# Patient Record
Sex: Male | Born: 1985 | Race: Black or African American | Hispanic: No | Smoking: Never smoker
Health system: Southern US, Community
[De-identification: ages and names within clinical notes are randomized; demographics above are authoritative.]

## PROBLEM LIST (undated history)

## (undated) DIAGNOSIS — S43006A Unspecified dislocation of unspecified shoulder joint, initial encounter: Secondary | ICD-10-CM

## (undated) HISTORY — PX: HAND SURGERY: SHX662

---
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
## 2012-03-12 ENCOUNTER — Emergency Department (HOSPITAL_COMMUNITY): Payer: Self-pay

## 2012-03-12 ENCOUNTER — Inpatient Hospital Stay (HOSPITAL_COMMUNITY): Payer: Self-pay

## 2012-03-12 ENCOUNTER — Encounter (HOSPITAL_COMMUNITY): Payer: Self-pay | Admitting: Emergency Medicine

## 2012-03-12 ENCOUNTER — Emergency Department (HOSPITAL_COMMUNITY): Payer: Self-pay | Admitting: Anesthesiology

## 2012-03-12 ENCOUNTER — Encounter (HOSPITAL_COMMUNITY): Admission: EM | Disposition: A | Discharge status: Home / Self Care | Payer: Self-pay | Source: Home / Self Care

## 2012-03-12 ENCOUNTER — Encounter (HOSPITAL_COMMUNITY): Payer: Self-pay | Admitting: Anesthesiology

## 2012-03-12 ENCOUNTER — Inpatient Hospital Stay (HOSPITAL_COMMUNITY)
Admission: EM | Admit: 2012-03-12 | Discharge: 2012-03-14 | DRG: 513 | Disposition: A | Discharge status: Home / Self Care | Payer: MEDICAID | Attending: Surgery | Admitting: Surgery

## 2012-03-12 DIAGNOSIS — S43036A Inferior dislocation of unspecified humerus, initial encounter: Principal | ICD-10-CM | POA: Diagnosis present

## 2012-03-12 DIAGNOSIS — S61409A Unspecified open wound of unspecified hand, initial encounter: Secondary | ICD-10-CM | POA: Diagnosis present

## 2012-03-12 DIAGNOSIS — S0180XA Unspecified open wound of other part of head, initial encounter: Secondary | ICD-10-CM

## 2012-03-12 DIAGNOSIS — Y9229 Other specified public building as the place of occurrence of the external cause: Secondary | ICD-10-CM

## 2012-03-12 DIAGNOSIS — S51819A Laceration without foreign body of unspecified forearm, initial encounter: Secondary | ICD-10-CM

## 2012-03-12 DIAGNOSIS — S41109A Unspecified open wound of unspecified upper arm, initial encounter: Secondary | ICD-10-CM

## 2012-03-12 DIAGNOSIS — S51809A Unspecified open wound of unspecified forearm, initial encounter: Secondary | ICD-10-CM | POA: Diagnosis present

## 2012-03-12 DIAGNOSIS — S0181XA Laceration without foreign body of other part of head, initial encounter: Secondary | ICD-10-CM | POA: Diagnosis present

## 2012-03-12 DIAGNOSIS — S0100XA Unspecified open wound of scalp, initial encounter: Secondary | ICD-10-CM | POA: Diagnosis present

## 2012-03-12 DIAGNOSIS — S0101XA Laceration without foreign body of scalp, initial encounter: Secondary | ICD-10-CM | POA: Diagnosis present

## 2012-03-12 DIAGNOSIS — S61209A Unspecified open wound of unspecified finger without damage to nail, initial encounter: Secondary | ICD-10-CM | POA: Diagnosis present

## 2012-03-12 DIAGNOSIS — S43006A Unspecified dislocation of unspecified shoulder joint, initial encounter: Secondary | ICD-10-CM

## 2012-03-12 DIAGNOSIS — S41119A Laceration without foreign body of unspecified upper arm, initial encounter: Secondary | ICD-10-CM

## 2012-03-12 DIAGNOSIS — S01409A Unspecified open wound of unspecified cheek and temporomandibular area, initial encounter: Secondary | ICD-10-CM | POA: Diagnosis present

## 2012-03-12 DIAGNOSIS — S0191XA Laceration without foreign body of unspecified part of head, initial encounter: Secondary | ICD-10-CM

## 2012-03-12 DIAGNOSIS — S61419A Laceration without foreign body of unspecified hand, initial encounter: Secondary | ICD-10-CM

## 2012-03-12 DIAGNOSIS — S41009A Unspecified open wound of unspecified shoulder, initial encounter: Secondary | ICD-10-CM | POA: Diagnosis present

## 2012-03-12 HISTORY — PX: I & D EXTREMITY: SHX5045

## 2012-03-12 LAB — PROTIME-INR
INR: 1.13 (ref 0.00–1.49)
Prothrombin Time: 20.1 seconds — ABNORMAL HIGH (ref 11.6–15.2)

## 2012-03-12 LAB — BASIC METABOLIC PANEL
Chloride: 111 mEq/L (ref 96–112)
GFR calc Af Amer: >90 mL/min (ref >90)
GFR calc non Af Amer: >90 mL/min (ref >90)
Potassium: 3.4 mEq/L — ABNORMAL LOW (ref 3.5–5.1)
Sodium: 144 mEq/L (ref 135–145)

## 2012-03-12 LAB — URINALYSIS, MICROSCOPIC ONLY
Glucose, UA: NEGATIVE mg/dL
Ketones, ur: NEGATIVE mg/dL
Leukocytes, UA: NEGATIVE
Nitrite: NEGATIVE
Protein, ur: NEGATIVE mg/dL
Urobilinogen, UA: 0.2 mg/dL (ref 0.0–1.0)

## 2012-03-12 LAB — CBC
MCH: 25 pg — ABNORMAL LOW (ref 26.0–34.0)
MCHC: 32.6 g/dL (ref 30.0–36.0)
MCHC: 31.4 g/dL (ref 30.0–36.0)
Platelets: 151 10*3/uL (ref 150–400)
Platelets: 98 10*3/uL — ABNORMAL LOW (ref 150–400)
RDW: 14.5 % (ref 11.5–15.5)
RDW: 14.7 % (ref 11.5–15.5)
WBC: 6.3 10*3/uL (ref 4.0–10.5)

## 2012-03-12 LAB — TYPE AND SCREEN
Antibody Screen: NEGATIVE
Unit division: 0

## 2012-03-12 LAB — POCT I-STAT, CHEM 8
BUN: 14 mg/dL (ref 6–23)
Chloride: 109 mEq/L (ref 96–112)
Glucose, Bld: 140 mg/dL — ABNORMAL HIGH (ref 70–99)
HCT: 44 % (ref 39.0–52.0)
Potassium: 2.8 mEq/L — ABNORMAL LOW (ref 3.5–5.1)

## 2012-03-12 LAB — GLUCOSE, CAPILLARY: Glucose-Capillary: 105 mg/dL — ABNORMAL HIGH (ref 70–99)

## 2012-03-12 LAB — COMPREHENSIVE METABOLIC PANEL
ALT: 18 U/L (ref 0–53)
AST: 25 U/L (ref 0–37)
Alkaline Phosphatase: 49 U/L (ref 39–117)
CO2: 21 mEq/L (ref 19–32)
Chloride: 106 mEq/L (ref 96–112)
GFR calc Af Amer: 89 mL/min — ABNORMAL LOW (ref >90)
GFR calc non Af Amer: 77 mL/min — ABNORMAL LOW (ref >90)
Glucose, Bld: 147 mg/dL — ABNORMAL HIGH (ref 70–99)
Sodium: 144 mEq/L (ref 135–145)
Total Bilirubin: 0.3 mg/dL (ref 0.3–1.2)

## 2012-03-12 LAB — MRSA PCR SCREENING: MRSA by PCR: NEGATIVE

## 2012-03-12 SURGERY — WOUND EXPLORATION
Anesthesia: General | Site: Head | Wound class: Dirty or Infected

## 2012-03-12 MED ORDER — HYDROGEN PEROXIDE 3 % EX SOLN
CUTANEOUS | Status: DC | PRN
  Administered 2012-03-12: 1 via TOPICAL

## 2012-03-12 MED ORDER — KCL IN DEXTROSE-NACL 20-5-0.45 MEQ/L-%-% IV SOLN
INTRAVENOUS | Status: DC
  Administered 2012-03-12: 1000 mL via INTRAVENOUS
  Administered 2012-03-12 – 2012-03-13 (×3): via INTRAVENOUS
  Filled 2012-03-12 (×8): qty 1000

## 2012-03-12 MED ORDER — KCL IN DEXTROSE-NACL 20-5-0.45 MEQ/L-%-% IV SOLN
INTRAVENOUS | Status: AC
  Administered 2012-03-12: 1000 mL via INTRAVENOUS
  Filled 2012-03-12: qty 1000

## 2012-03-12 MED ORDER — MIDAZOLAM HCL 2 MG/2ML IJ SOLN: 1.0000 mg | INTRAMUSCULAR | Status: DC | PRN

## 2012-03-12 MED ORDER — ONDANSETRON HCL 4 MG/2ML IJ SOLN
4.0000 mg | Freq: Four times a day (QID) | INTRAMUSCULAR | Status: DC | PRN
  Administered 2012-03-12: 4 mg via INTRAVENOUS
  Filled 2012-03-12: qty 2

## 2012-03-12 MED ORDER — 0.9 % SODIUM CHLORIDE (POUR BTL) OPTIME
TOPICAL | Status: DC | PRN
  Administered 2012-03-12: 1000 mL

## 2012-03-12 MED ORDER — CEFAZOLIN SODIUM 1-5 GM-% IV SOLN
1.0000 g | Freq: Three times a day (TID) | INTRAVENOUS | Status: DC
  Administered 2012-03-12 – 2012-03-14 (×6): 1 g via INTRAVENOUS
  Filled 2012-03-12 (×9): qty 50

## 2012-03-12 MED ORDER — PHENYLEPHRINE HCL 10 MG/ML IJ SOLN
INTRAMUSCULAR | Status: DC | PRN
  Administered 2012-03-12: 100 ug via INTRAVENOUS
  Administered 2012-03-12: 10 ug via INTRAVENOUS
  Administered 2012-03-12 (×3): 100 ug via INTRAVENOUS

## 2012-03-12 MED ORDER — PANTOPRAZOLE SODIUM 40 MG PO TBEC
40.0000 mg | DELAYED_RELEASE_TABLET | Freq: Every day | ORAL | Status: DC
  Administered 2012-03-12 – 2012-03-14 (×3): 40 mg via ORAL
  Filled 2012-03-12 (×3): qty 1

## 2012-03-12 MED ORDER — EPHEDRINE SULFATE 50 MG/ML IJ SOLN
INTRAMUSCULAR | Status: DC | PRN
  Administered 2012-03-12: 5 mg via INTRAVENOUS
  Administered 2012-03-12: 10 mg via INTRAVENOUS

## 2012-03-12 MED ORDER — MORPHINE SULFATE 2 MG/ML IJ SOLN
2.0000 mg | INTRAMUSCULAR | Status: DC | PRN
  Administered 2012-03-12: 2 mg via INTRAVENOUS
  Filled 2012-03-12: qty 1

## 2012-03-12 MED ORDER — HYDROCODONE-ACETAMINOPHEN 5-325 MG PO TABS: 0.5000 | ORAL_TABLET | ORAL | Status: DC | PRN

## 2012-03-12 MED ORDER — PROMETHAZINE HCL 25 MG/ML IJ SOLN: 6.2500 mg | INTRAMUSCULAR | Status: DC | PRN

## 2012-03-12 MED ORDER — ONDANSETRON HCL 4 MG PO TABS: 4.0000 mg | ORAL_TABLET | Freq: Four times a day (QID) | ORAL | Status: DC | PRN

## 2012-03-12 MED ORDER — HYDROMORPHONE HCL PF 1 MG/ML IJ SOLN: 0.2500 mg | INTRAMUSCULAR | Status: DC | PRN

## 2012-03-12 MED ORDER — OXYCODONE HCL 5 MG PO TABS
10.0000 mg | ORAL_TABLET | ORAL | Status: DC | PRN
  Administered 2012-03-12 – 2012-03-14 (×6): 10 mg via ORAL
  Filled 2012-03-12: qty 2
  Filled 2012-03-12: qty 1
  Filled 2012-03-12: qty 2
  Filled 2012-03-12: qty 1
  Filled 2012-03-12 (×4): qty 2

## 2012-03-12 MED ORDER — DOUBLE ANTIBIOTIC 500-10000 UNIT/GM EX OINT
TOPICAL_OINTMENT | CUTANEOUS | Status: AC
  Filled 2012-03-12: qty 1

## 2012-03-12 MED ORDER — PROPOFOL 10 MG/ML IV EMUL
INTRAVENOUS | Status: DC | PRN
  Administered 2012-03-12: 180 mg via INTRAVENOUS

## 2012-03-12 MED ORDER — HETASTARCH-ELECTROLYTES 6 % IV SOLN
INTRAVENOUS | Status: DC | PRN
  Administered 2012-03-12 (×2): via INTRAVENOUS

## 2012-03-12 MED ORDER — FENTANYL CITRATE 0.05 MG/ML IJ SOLN: 50.0000 ug | INTRAMUSCULAR | Status: DC | PRN

## 2012-03-12 MED ORDER — GLYCOPYRROLATE 0.2 MG/ML IJ SOLN
INTRAMUSCULAR | Status: DC | PRN
  Administered 2012-03-12: .5 mg via INTRAVENOUS

## 2012-03-12 MED ORDER — MORPHINE SULFATE 4 MG/ML IJ SOLN
4.0000 mg | INTRAMUSCULAR | Status: DC | PRN
  Administered 2012-03-13: 4 mg via INTRAVENOUS
  Filled 2012-03-12: qty 1

## 2012-03-12 MED ORDER — SODIUM CHLORIDE 0.9 % IV SOLN
INTRAVENOUS | Status: DC | PRN
  Administered 2012-03-12 (×2): via INTRAVENOUS

## 2012-03-12 MED ORDER — LACTATED RINGERS IV SOLN
INTRAVENOUS | Status: DC | PRN
  Administered 2012-03-12 (×3): via INTRAVENOUS

## 2012-03-12 MED ORDER — ROCURONIUM BROMIDE 100 MG/10ML IV SOLN
INTRAVENOUS | Status: DC | PRN
  Administered 2012-03-12: 20 mg via INTRAVENOUS

## 2012-03-12 MED ORDER — CEFAZOLIN SODIUM 1-5 GM-% IV SOLN
INTRAVENOUS | Status: DC | PRN
  Administered 2012-03-12 (×2): 1 g via INTRAVENOUS

## 2012-03-12 MED ORDER — LIDOCAINE HCL (CARDIAC) 20 MG/ML IV SOLN
INTRAVENOUS | Status: DC | PRN
  Administered 2012-03-12: 100 mg via INTRAVENOUS

## 2012-03-12 MED ORDER — PANTOPRAZOLE SODIUM 40 MG IV SOLR
40.0000 mg | Freq: Every day | INTRAVENOUS | Status: DC
  Filled 2012-03-12 (×2): qty 40

## 2012-03-12 MED ORDER — NEOSTIGMINE METHYLSULFATE 1 MG/ML IJ SOLN
INTRAMUSCULAR | Status: DC | PRN
  Administered 2012-03-12: 4 mg via INTRAVENOUS

## 2012-03-12 MED ORDER — OXYCODONE HCL 5 MG PO TABS
5.0000 mg | ORAL_TABLET | ORAL | Status: DC | PRN
  Administered 2012-03-13: 5 mg via ORAL
  Filled 2012-03-12: qty 1

## 2012-03-12 MED ORDER — FENTANYL CITRATE 0.05 MG/ML IJ SOLN
INTRAMUSCULAR | Status: DC | PRN
  Administered 2012-03-12: 250 ug via INTRAVENOUS

## 2012-03-12 MED ORDER — BACITRACIN ZINC 500 UNIT/GM EX OINT
1.0000 "application " | TOPICAL_OINTMENT | Freq: Two times a day (BID) | CUTANEOUS | Status: DC
  Administered 2012-03-12 – 2012-03-14 (×5): 1 via TOPICAL
  Filled 2012-03-12 (×2): qty 15

## 2012-03-12 MED ORDER — MORPHINE SULFATE 2 MG/ML IJ SOLN
1.0000 mg | INTRAMUSCULAR | Status: DC | PRN
  Administered 2012-03-12: 1 mg via INTRAVENOUS
  Filled 2012-03-12: qty 1

## 2012-03-12 MED ORDER — SUCCINYLCHOLINE CHLORIDE 20 MG/ML IJ SOLN
INTRAMUSCULAR | Status: DC | PRN
  Administered 2012-03-12: 140 mg via INTRAVENOUS

## 2012-03-12 MED ORDER — MORPHINE SULFATE 2 MG/ML IJ SOLN
INTRAMUSCULAR | Status: AC
  Administered 2012-03-12: 2 mg via INTRAVENOUS
  Filled 2012-03-12: qty 1

## 2012-03-12 MED ORDER — ONDANSETRON HCL 4 MG/2ML IJ SOLN
INTRAMUSCULAR | Status: DC | PRN
  Administered 2012-03-12: 4 mg via INTRAVENOUS

## 2012-03-12 SURGICAL SUPPLY — 55 items
BANDAGE ELASTIC 4 VELCRO ST LF (GAUZE/BANDAGES/DRESSINGS) ×3 IMPLANT
BANDAGE ESMARK 6X9 LF (GAUZE/BANDAGES/DRESSINGS) ×2 IMPLANT
BANDAGE GAUZE 4  KLING STR (GAUZE/BANDAGES/DRESSINGS) ×3 IMPLANT
BANDAGE GAUZE ELAST BULKY 4 IN (GAUZE/BANDAGES/DRESSINGS) ×3 IMPLANT
BLADE SURG 10 STRL SS (BLADE) ×3 IMPLANT
BNDG COHESIVE 4X5 TAN STRL (GAUZE/BANDAGES/DRESSINGS) ×3 IMPLANT
BNDG COHESIVE 6X5 TAN STRL LF (GAUZE/BANDAGES/DRESSINGS) ×3 IMPLANT
BNDG ESMARK 6X9 LF (GAUZE/BANDAGES/DRESSINGS) ×3
CHLORAPREP W/TINT 26ML (MISCELLANEOUS) ×3 IMPLANT
CLOTH BEACON ORANGE TIMEOUT ST (SAFETY) ×3 IMPLANT
COVER SURGICAL LIGHT HANDLE (MISCELLANEOUS) ×3 IMPLANT
CUFF TOURNIQUET SINGLE 34IN LL (TOURNIQUET CUFF) IMPLANT
CUFF TOURNIQUET SINGLE 44IN (TOURNIQUET CUFF) IMPLANT
DRAPE U-SHAPE 47X51 STRL (DRAPES) ×3 IMPLANT
DRSG ADAPTIC 3X8 NADH LF (GAUZE/BANDAGES/DRESSINGS) ×3 IMPLANT
DRSG MEPILEX BORDER 4X4 (GAUZE/BANDAGES/DRESSINGS) ×6 IMPLANT
DRSG PAD ABDOMINAL 8X10 ST (GAUZE/BANDAGES/DRESSINGS) IMPLANT
ELECT REM PT RETURN 9FT ADLT (ELECTROSURGICAL) ×3
ELECTRODE REM PT RTRN 9FT ADLT (ELECTROSURGICAL) ×2 IMPLANT
GAUZE SPONGE 2X2 8PLY STRL LF (GAUZE/BANDAGES/DRESSINGS) ×2 IMPLANT
GAUZE XEROFORM 5X9 LF (GAUZE/BANDAGES/DRESSINGS) ×3 IMPLANT
GLOVE BIO SURGEON STRL SZ8 (GLOVE) ×3 IMPLANT
GLOVE BIOGEL PI IND STRL 8 (GLOVE) ×2 IMPLANT
GLOVE BIOGEL PI INDICATOR 8 (GLOVE) ×1
GOWN PREVENTION PLUS XLARGE (GOWN DISPOSABLE) ×3 IMPLANT
GOWN STRL NON-REIN LRG LVL3 (GOWN DISPOSABLE) ×6 IMPLANT
KIT BASIN OR (CUSTOM PROCEDURE TRAY) ×3 IMPLANT
KIT ROOM TURNOVER OR (KITS) ×3 IMPLANT
MANIFOLD NEPTUNE II (INSTRUMENTS) ×3 IMPLANT
NS IRRIG 1000ML POUR BTL (IV SOLUTION) ×3 IMPLANT
PACK ORTHO EXTREMITY (CUSTOM PROCEDURE TRAY) ×3 IMPLANT
PAD ARMBOARD 7.5X6 YLW CONV (MISCELLANEOUS) ×6 IMPLANT
PAD CAST 4YDX4 CTTN HI CHSV (CAST SUPPLIES) ×2 IMPLANT
PADDING CAST COTTON 4X4 STRL (CAST SUPPLIES) ×1
SOAP 2 % CHG 4 OZ (WOUND CARE) ×3 IMPLANT
SPLINT PLASTER CAST XFAST 4X15 (CAST SUPPLIES) ×2 IMPLANT
SPLINT PLASTER XTRA FAST SET 4 (CAST SUPPLIES) ×1
SPONGE GAUZE 2X2 STER 10/PKG (GAUZE/BANDAGES/DRESSINGS) ×1
SPONGE GAUZE 4X4 12PLY (GAUZE/BANDAGES/DRESSINGS) ×3 IMPLANT
STAPLER VISISTAT 35W (STAPLE) IMPLANT
SUCTION FRAZIER TIP 10 FR DISP (SUCTIONS) ×3 IMPLANT
SUT ETHIBOND 4 0 TF (SUTURE) ×9 IMPLANT
SUT ETHILON 3 0 FSL (SUTURE) IMPLANT
SUT MNCRL AB 3-0 PS2 18 (SUTURE) ×12 IMPLANT
SUT PROLENE 3 0 PS 2 (SUTURE) ×3 IMPLANT
SUT VIC AB 2-0 CT1 27 (SUTURE) ×2
SUT VIC AB 2-0 CT1 TAPERPNT 27 (SUTURE) ×4 IMPLANT
SUT VIC AB 3-0 PS2 18 (SUTURE) ×1
SUT VIC AB 3-0 PS2 18XBRD (SUTURE) ×2 IMPLANT
SUT VIC AB 5-0 PS2 18 (SUTURE) ×9 IMPLANT
TOWEL OR 17X24 6PK STRL BLUE (TOWEL DISPOSABLE) ×3 IMPLANT
TOWEL OR 17X26 10 PK STRL BLUE (TOWEL DISPOSABLE) ×3 IMPLANT
TUBE CONNECTING 12X1/4 (SUCTIONS) ×3 IMPLANT
WATER STERILE IRR 1000ML POUR (IV SOLUTION) ×3 IMPLANT
YANKAUER SUCT BULB TIP NO VENT (SUCTIONS) ×3 IMPLANT

## 2012-03-12 NOTE — ED Notes (Signed)
PT ROLLED TO OR; REPORT GIVEN TO SUSAN, CRNA

## 2012-03-12 NOTE — Progress Notes (Signed)
Ice applied to right side of face over intact suture lines.  No drainage from incisions and no swelling noted.  Patient seems comfortable following earlier dose of Morphine 2mg .Foley catheter removed intact and medium sized condom catheter applied with patient's understanding.

## 2012-03-12 NOTE — ED Notes (Signed)
[place]. A&Ox3; no respiratory distress.

## 2012-03-12 NOTE — ED Notes (Signed)
CSI took pt's shirts. Other belongings logged with security.

## 2012-03-12 NOTE — ED Notes (Signed)
1 pair of jeans, 1 black belt , 1 pair of shoes, 1 pair of socks all collected and placed in pt belongings bag. Wallet, keys and phone given to security.
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
## 2012-03-12 NOTE — Anesthesia Preprocedure Evaluation (Addendum)
Anesthesia Evaluation  Patient identified by MRN, date of birth, ID band Patient awake    Reviewed: Allergy & Precautions, H&P , NPO status , Patient's Chart, lab work & pertinent test results  Airway Mallampati: II TM Distance: >3 FB Neck ROM: Full    Dental  (+) Teeth Intact and Dental Advisory Given   Pulmonary Current Smoker,  breath sounds clear to auscultation        Cardiovascular Rhythm:Regular Rate:Tachycardia     Neuro/Psych    GI/Hepatic (+)     substance abuse  alcohol use and marijuana use,   Endo/Other    Renal/GU      Musculoskeletal   Abdominal   Peds  Hematology   Anesthesia Other Findings signif ETOH smell Heavy scalp bleeding, smaller arms lacerations, dislocated r shoulder  Reproductive/Obstetrics                         Anesthesia Physical Anesthesia Plan  ASA: II and Emergent  Anesthesia Plan: General   Post-op Pain Management:    Induction: Intravenous, Rapid sequence and Cricoid pressure planned  Airway Management Planned: Oral ETT  Additional Equipment:   Intra-op Plan:   Post-operative Plan: Extubation in OR  Informed Consent: I have reviewed the patients History and Physical, chart, labs and discussed the procedure including the risks, benefits and alternatives for the proposed anesthesia with the patient or authorized representative who has indicated his/her understanding and acceptance.     Plan Discussed with: CRNA and Surgeon  Anesthesia Plan Comments:        Anesthesia Quick Evaluation

## 2012-03-12 NOTE — ED Notes (Signed)
XRAY AT THIS TIME.

## 2012-03-12 NOTE — Anesthesia Postprocedure Evaluation (Signed)
  Anesthesia Post-op Note  Patient: [name]  Procedure(s) Performed: Procedure(s) (LRB): WOUND EXPLORATION (N/A) SCALP LACERATION REPAIR (N/A) IRRIGATION AND DEBRIDEMENT EXTREMITY (Left)  Patient Location: PACU  Anesthesia Type: General  Level of Consciousness: awake  Airway and Oxygen Therapy: Patient Spontanous Breathing  Post-op Pain: mild  Post-op Assessment: Post-op Vital signs reviewed, Patient's Cardiovascular Status Stable, Respiratory Function Stable, Patent Airway, No signs of Nausea or vomiting and Pain level controlled  Post-op Vital Signs: stable  Complications: No apparent anesthesia complications

## 2012-03-12 NOTE — Transfer of Care (Signed)
Immediate Anesthesia Transfer of Care Note  Patient: Aster Xxxsimpson  Procedure(s) Performed: Procedure(s) (LRB): WOUND EXPLORATION (N/A) SCALP LACERATION REPAIR (N/A) IRRIGATION AND DEBRIDEMENT EXTREMITY (Left)  Patient Location: PACU  Anesthesia Type: General  Level of Consciousness: sedated  Airway & Oxygen Therapy: Patient Spontanous Breathing and Patient connected to nasal cannula oxygen  Post-op Assessment: Report given to PACU RN and Post -op Vital signs reviewed and stable  Post vital signs: Reviewed and stable  Complications: No apparent anesthesia complications

## 2012-03-12 NOTE — Anesthesia Procedure Notes (Signed)
Procedure Name: Intubation Date/Time: 03/12/2012 3:22 AM Performed by: Eliyah Bazzi S Pre-anesthesia Checklist: Patient identified, Emergency Drugs available, Suction available, Patient being monitored and Timeout performed Patient Re-evaluated:Patient Re-evaluated prior to inductionOxygen Delivery Method: Circle system utilized Preoxygenation: Pre-oxygenation with 100% oxygen Intubation Type: IV induction Laryngoscope Size: Mac and 4 Grade View: Grade I Tube size: 8.5 mm Number of attempts: 1 Airway Equipment and Method: Stylet Placement Confirmation: ETT inserted through vocal cords under direct vision,  positive ETCO2 and breath sounds checked- equal and bilateral Secured at: 22 cm Tube secured with: Tape Dental Injury: Teeth and Oropharynx as per pre-operative assessment
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
## 2012-03-12 NOTE — Plan of Care (Signed)
Problem: Phase I Progression Outcomes Goal: Pain controlled with appropriate interventions Outcome: Progressing Refer to [facility] for pain meds used Goal: Tubes/drains patent Outcome: Completed/Met Date Met:  03/12/12 Foley catheter from surgery- no order to discontinue.  Will change to condom catheter Goal: Voiding-avoid urinary catheter unless indicated Outcome: Not Met (add Reason) Came from surgeryd with foley.  Changed to condom cath

## 2012-03-12 NOTE — Brief Op Note (Signed)
03/12/2012  6:00 AM  PATIENT:  [name]  26 y.o. male  PRE-OPERATIVE DIAGNOSIS: 1.  R shoulder dislocation 2.  Multiple left shoulder, arm, forearm and hand lacerations.     POST-OPERATIVE DIAGNOSIS:   1.  Right shoulder dislocation 2.  L hand palmar laceration 10 cm 3.  L long finger palmar laceration 1 cm 4.  L shoulder / arm / forearm volar lacerations 15 cm 5.  L ring finger FDS laceration > 50%  Procedure(s): 1.  Closed reduction of right shoulder dislocation under anesthesia 2.  Fluoro 3.  Complex closure left arm and forearm lacerations 5 cm 4.  Simple closure left shoulder, arm and forearm lacerations 10 cm 5.  Exploration of left palmar hand wound 6.  Repair of left ring finger FDS tendon in zone 2. 7.  Complex closure left palmar hand wound 10 cm 8.  Simple closure left long finger palmar laceration 1 cm  SURGEON:  Toni Arthurs, MD  ASSISTANT: n/a  ANESTHESIA:   General  EBL:  See Dr. Fatima [name] op note  TOURNIQUET:  n/a  COMPLICATIONS:  None apparent  DISPOSITION:  Extubated, awake and stable to recovery.  DICTATION ID:  [id number]
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
## 2012-03-12 NOTE — Preoperative (Signed)
Beta Blockers   Reason not to administer Beta Blockers:Not Applicable 
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
## 2012-03-13 ENCOUNTER — Encounter (HOSPITAL_COMMUNITY): Payer: Self-pay

## 2012-03-13 NOTE — Progress Notes (Signed)
Patient ID: [name], male   DOB: 1986-05-23, 26 y.o.   MRN: [id number] Subjective: 1 Day Post-Op Procedure(s) (LRB): WOUND EXPLORATION (N/A) SCALP LACERATION REPAIR (N/A) IRRIGATION AND DEBRIDEMENT EXTREMITY (Left)    Patient reports pain as moderate in left hand but reports that right shoulder arm don't hurt at all  Objective:   VITALS:   Filed Vitals:   03/13/12 0759  BP:   Pulse:   Temp: 99.3 F (37.4 C)  Resp:    Exam: Awake alert Right shoulder sling on right arm, needs adjustment Left upper extremity splint and dressing dry, some movement of fingers, but not stressed, would need to defer exam findings and changes to Dr. Victorino Dike and his intraoperative findings  LABS  Basename 03/12/12 0630 03/12/12 0250 03/12/12 0244  HGB 8.1* 15.0 13.2  HCT 25.8* 44.0 40.5  WBC 6.3 -- 5.9  PLT 98* -- 151     Basename 03/12/12 0630 03/12/12 0250 03/12/12 0244  NA 144 146* 144  K 3.4* 2.8* 2.8*  BUN 12 14 14   CREATININE 0.99 1.40* 1.28  GLUCOSE 122* 140* 147*     Basename 03/12/12 0630 03/12/12 0244  LABPT -- --  INR 1.68* 1.13     Assessment/Plan: 1 Day Post-Op Procedure(s) (LRB): WOUND EXPLORATION (N/A) SCALP LACERATION REPAIR (N/A) IRRIGATION AND DEBRIDEMENT EXTREMITY (Left)  Stable orthopaedically LUE plan per [facility] Right UE in sling for 3 weeks or until further follow up in outpatient office setting
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
## 2012-03-14 MED ORDER — OXYCODONE HCL 10 MG PO TABS: 10.0000 mg | ORAL_TABLET | ORAL | Status: AC | PRN

## 2012-03-14 MED FILL — Ophthalmic Irrigation Solution - Intraocular: INTRAOCULAR | Qty: 500 | Status: AC
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
## 2012-03-14 NOTE — Progress Notes (Signed)
Discharge home.

## 2012-03-14 NOTE — Progress Notes (Signed)
UR completed.   Discount card provided for medication assistance.   Spoke with family about cost of outpt OT.  Gave them option of [facility] on [address] as well as OT at [facility]. Pt to make appt with Dr. Victorino Dike in one week if he can't do the OT appt (due to cost-$176/visit) so that the dressing can be removed.   Family members and pt stated understanding of all instructions.

## 2012-03-14 NOTE — Progress Notes (Signed)
Orthopedic Tech Progress Note Patient Details:  [name] 1986-06-26 [id number]  Ortho Devices Type of Ortho Device: Arm foam sling Ortho Device/Splint Location: bilateral Ortho Device/Splint Interventions: Application   [name] 03/14/2012, 3:05 PM

## 2012-03-14 NOTE — Discharge Instructions (Signed)
1.  Wear left and right arm slings when up out of bed and walking.  Ok to take sling off to wash and while sitting. 2.  Keep left arm elevated on pillows while in bed 3.  Leave your dressing on your left arm in place until your occupational therapy appointment in 1 week 4.  Call trauma office at [phone] to schedule an appointment to see [place] in 2 weeks 5.  Call Dr. Laverta Baltimore office if you do not receive an appointment to see him in 2 weeks 6.  See occupational therapy in 1 week to begin therapy for left hand. 7.  Call Dr. Dwyane Luo office if you do not receive an appointment to see her in 2 weeks.

## 2012-03-14 NOTE — Progress Notes (Signed)
This patient has been seen and I agree with the findings and treatment plan.  Charmane Protzman O. Mannat Benedetti, III, MD, FACS [phone] (pager) [phone] (direct pager) Trauma Surgeon  
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
## 2012-03-15 ENCOUNTER — Encounter (HOSPITAL_COMMUNITY): Payer: Self-pay | Admitting: Surgery

## 2012-03-25 ENCOUNTER — Telehealth (HOSPITAL_COMMUNITY): Payer: Self-pay | Admitting: Orthopedic Surgery

## 2012-03-25 NOTE — Telephone Encounter (Signed)
Patient has had appt with Dr. Kelly Splinter and has f/u scheduled with Dr. Victorino Dike. I told him that I didn't think he needed to follow up with [place] but to call if he had any problems.

## 2012-10-10 ENCOUNTER — Emergency Department (HOSPITAL_COMMUNITY)
Admission: EM | Admit: 2012-10-10 | Discharge: 2012-10-11 | Disposition: A | Discharge status: Home / Self Care | Payer: Self-pay | Attending: Emergency Medicine | Admitting: Emergency Medicine

## 2012-10-10 ENCOUNTER — Emergency Department (HOSPITAL_COMMUNITY): Payer: Self-pay

## 2012-10-10 ENCOUNTER — Encounter (HOSPITAL_COMMUNITY): Payer: Self-pay | Admitting: *Deleted

## 2012-10-10 DIAGNOSIS — S43006A Unspecified dislocation of unspecified shoulder joint, initial encounter: Secondary | ICD-10-CM | POA: Insufficient documentation

## 2012-10-10 DIAGNOSIS — X500XXA Overexertion from strenuous movement or load, initial encounter: Secondary | ICD-10-CM | POA: Insufficient documentation

## 2012-10-10 DIAGNOSIS — Y929 Unspecified place or not applicable: Secondary | ICD-10-CM | POA: Insufficient documentation

## 2012-10-10 DIAGNOSIS — Y9349 Activity, other involving dancing and other rhythmic movements: Secondary | ICD-10-CM | POA: Insufficient documentation

## 2012-10-10 DIAGNOSIS — S43004A Unspecified dislocation of right shoulder joint, initial encounter: Secondary | ICD-10-CM

## 2012-10-10 HISTORY — DX: Unspecified dislocation of unspecified shoulder joint, initial encounter: S43.006A

## 2012-10-10 MED ORDER — ETOMIDATE 2 MG/ML IV SOLN
0.1500 mg/kg | Freq: Once | INTRAVENOUS | Status: AC
  Administered 2012-10-10: 20 mg via INTRAVENOUS
  Filled 2012-10-10: qty 10

## 2012-10-10 MED ORDER — MORPHINE SULFATE 4 MG/ML IJ SOLN
6.0000 mg | Freq: Once | INTRAMUSCULAR | Status: DC
  Filled 2012-10-10: qty 2
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
## 2012-10-10 NOTE — ED Notes (Signed)
Steward sedation score 6 

## 2012-10-11 MED ORDER — HYDROCODONE-ACETAMINOPHEN 5-325 MG PO TABS: 1.0000 | ORAL_TABLET | ORAL | Status: DC | PRN

## 2012-10-11 NOTE — ED Provider Notes (Signed)
History     CSN: [id number]  Arrival date & time 10/10/12  4098   First MD Initiated Contact with Patient 10/10/12 2158      Chief Complaint  Patient presents with  . Shoulder Injury     The history is provided by the patient.   patient reports he was dancing while playing a video game when he injured his right shoulder.  He now presents with severe pain in his right shoulder with obvious right shoulder deformity consistent with anterior dislocation.  He denies numbness or weakness.  He has no other complaints.  His pain is worsened by movement or palpation of his right shoulder.  Past Medical History  Diagnosis Date  . Shoulder dislocation     Past Surgical History  Procedure Laterality Date  . I&d extremity  03/12/2012    Procedure: IRRIGATION AND DEBRIDEMENT EXTREMITY;  Surgeon: Toni Arthurs, MD;  Location: [facility] OR;  Service: Orthopedics;  Laterality: Left;    History reviewed. No pertinent family history.  History  Substance Use Topics  . Smoking status: Never Smoker   . Smokeless tobacco: Not on file  . Alcohol Use: No      Review of Systems  All other systems reviewed and are negative.    Allergies  Review of patient's allergies indicates no known allergies.  Home Medications   Current Outpatient Rx  Name  Route  Sig  Dispense  Refill  . HYDROcodone-acetaminophen (NORCO/VICODIN) 5-325 MG per tablet   Oral   Take 1 tablet by mouth every 4 (four) hours as needed for pain.   15 tablet   0     BP 129/80  Pulse 64  Temp(Src) 98.4 F (36.9 C) (Oral)  Resp 21  Wt 220 lb (99.791 kg)  BMI 29.03 kg/m2  SpO2 100%  Physical Exam  Nursing note and vitals reviewed. Constitutional: He is oriented to person, place, and time. He appears well-developed and well-nourished.  HENT:  Head: Normocephalic and atraumatic.  Eyes: EOM are normal.  Neck: Normal range of motion.  Cardiovascular: Normal rate, regular rhythm, normal heart sounds and intact distal pulses.    Pulmonary/Chest: Effort normal and breath sounds normal. No respiratory distress.  Abdominal: Soft. He exhibits no distension. There is no tenderness.  Musculoskeletal:  Squared off right shoulder. Normal right radial pulse  Neurological: He is alert and oriented to person, place, and time.  Skin: Skin is warm and dry.  Psychiatric: He has a normal mood and affect. Judgment normal.    ED Course  Reduction of dislocation Date/Time: 10/11/2012 12:18 AM Performed by: Lyanne Co Authorized by: Lyanne Co Consent: Verbal consent obtained. Risks and benefits: risks, benefits and alternatives were discussed Consent given by: patient Required items: required blood products, implants, devices, and special equipment available Patient identity confirmed: verbally with patient Time out: Immediately prior to procedure a "time out" was called to verify the correct patient, procedure, equipment, support staff and site/side marked as required. Patient sedated: see note. Patient tolerance: Patient tolerated the procedure well with no immediate complications. Comments: Reduction of right shoulder dislocation with external rotation and adduction   (including critical care time)  Procedural sedation Performed by: Lyanne Co Consent: Verbal consent obtained. Risks and benefits: risks, benefits and alternatives were discussed Required items: required blood products, implants, devices, and special equipment available Patient identity confirmed: arm band and provided demographic data Time out: Immediately prior to procedure a "time out" was called to verify the correct patient,  procedure, equipment, support staff and site/side marked as required. Sedation type: moderate (conscious) sedation NPO time confirmed and considedered Sedatives: ETOMIDATE Physician Time at Bedside: 15 Vitals: Vital signs were monitored during sedation. Cardiac Monitor, pulse oximeter Patient tolerance: Patient  tolerated the procedure well with no immediate complications. Comments: Pt with uneventful recovered. Returned to pre-procedural sedation baseline   Labs Reviewed - No data to display Dg Shoulder Right  10/11/2012  *RADIOLOGY REPORT*  Clinical Data: Post reduction  RIGHT SHOULDER - 2+ VIEW  Comparison: 10/10/2012  Findings: Interval reduction.  Humeral head now projects over the glenoid.  No displaced fracture.  Acromioclavicular joint intact.  IMPRESSION: Interval right shoulder reduction.   Original Report Authenticated By: Jearld Lesch, M.D.    Dg Shoulder Right  10/10/2012  *RADIOLOGY REPORT*  Clinical Data: Right shoulder injury today, pain, past history shoulder dislocation  RIGHT SHOULDER - 2+ VIEW  Comparison: None  Findings: Osseous mineralization normal. AC joint alignment normal. Anterior glenohumeral dislocation with humeral head the subcoracoid position. No acute fracture or bone destruction. Visualized right ribs intact.  IMPRESSION: Anterior glenohumeral dislocation.   Original Report Authenticated By: Ulyses Southward, M.D.    I personally reviewed the imaging tests through PACS system I reviewed available ER/hospitalization records through the EMR    1. Shoulder dislocation, right, initial encounter       MDM  Patient tolerated the procedure well.  Discharge home with orthopedic followup.  Home with pain medicine.        Lyanne Co, MD 10/11/12 [phone]

## 2013-07-23 ENCOUNTER — Emergency Department (HOSPITAL_COMMUNITY): Payer: BC Managed Care – PPO

## 2013-07-23 ENCOUNTER — Encounter (HOSPITAL_COMMUNITY): Payer: Self-pay | Admitting: Emergency Medicine

## 2013-07-23 ENCOUNTER — Emergency Department (HOSPITAL_COMMUNITY)
Admission: EM | Admit: 2013-07-23 | Discharge: 2013-07-23 | Disposition: A | Discharge status: Home / Self Care | Payer: BC Managed Care – PPO | Attending: Emergency Medicine | Admitting: Emergency Medicine

## 2013-07-23 DIAGNOSIS — S43004A Unspecified dislocation of right shoulder joint, initial encounter: Secondary | ICD-10-CM

## 2013-07-23 DIAGNOSIS — Y929 Unspecified place or not applicable: Secondary | ICD-10-CM | POA: Insufficient documentation

## 2013-07-23 DIAGNOSIS — S43006A Unspecified dislocation of unspecified shoulder joint, initial encounter: Secondary | ICD-10-CM | POA: Insufficient documentation

## 2013-07-23 DIAGNOSIS — Y9389 Activity, other specified: Secondary | ICD-10-CM | POA: Insufficient documentation

## 2013-07-23 DIAGNOSIS — X500XXA Overexertion from strenuous movement or load, initial encounter: Secondary | ICD-10-CM | POA: Insufficient documentation

## 2013-07-23 MED ORDER — FENTANYL CITRATE 0.05 MG/ML IJ SOLN
100.0000 ug | Freq: Once | INTRAMUSCULAR | Status: AC
  Administered 2013-07-23: 100 ug via INTRAVENOUS
  Filled 2013-07-23: qty 2

## 2013-07-23 MED ORDER — ETOMIDATE 2 MG/ML IV SOLN
15.0000 mg | Freq: Once | INTRAVENOUS | Status: AC
  Administered 2013-07-23: 15 mg via INTRAVENOUS
  Filled 2013-07-23: qty 10

## 2013-07-23 NOTE — ED Notes (Signed)
He states he dislocated his right shoulder this morning.  He states this has occurred before.  He states this occurred as he simply went to lie down on a couch.
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
## 2013-10-19 ENCOUNTER — Emergency Department (HOSPITAL_COMMUNITY)
Admission: EM | Admit: 2013-10-19 | Discharge: 2013-10-19 | Disposition: A | Discharge status: Home / Self Care | Payer: BC Managed Care – PPO | Attending: Emergency Medicine | Admitting: Emergency Medicine

## 2013-10-19 ENCOUNTER — Encounter (HOSPITAL_COMMUNITY): Payer: Self-pay | Admitting: Emergency Medicine

## 2013-10-19 DIAGNOSIS — Z23 Encounter for immunization: Secondary | ICD-10-CM | POA: Insufficient documentation

## 2013-10-19 DIAGNOSIS — W208XXA Other cause of strike by thrown, projected or falling object, initial encounter: Secondary | ICD-10-CM | POA: Insufficient documentation

## 2013-10-19 DIAGNOSIS — S61209A Unspecified open wound of unspecified finger without damage to nail, initial encounter: Secondary | ICD-10-CM | POA: Insufficient documentation

## 2013-10-19 DIAGNOSIS — Y929 Unspecified place or not applicable: Secondary | ICD-10-CM | POA: Insufficient documentation

## 2013-10-19 DIAGNOSIS — S61411A Laceration without foreign body of right hand, initial encounter: Secondary | ICD-10-CM

## 2013-10-19 DIAGNOSIS — Y939 Activity, unspecified: Secondary | ICD-10-CM | POA: Insufficient documentation

## 2013-10-19 MED ORDER — TETANUS-DIPHTH-ACELL PERTUSSIS 5-2.5-18.5 LF-MCG/0.5 IM SUSP
0.5000 mL | Freq: Once | INTRAMUSCULAR | Status: AC
  Administered 2013-10-19: 0.5 mL via INTRAMUSCULAR
  Filled 2013-10-19: qty 0.5

## 2013-10-19 NOTE — Discharge Instructions (Signed)
Have stitches removed in approximately 10 days. Keep it clean and dry. If you were given medicines take as directed.  If you are on coumadin or contraceptives realize their levels and effectiveness is altered by many different medicines.  If you have any reaction (rash, tongues swelling, other) to the medicines stop taking and see a physician.   Please follow up as directed and return to the ER or see a physician for new or worsening symptoms or signs of infection such as pus, redness, fevers.  Thank you. Filed Vitals:   10/19/13 1639  BP: 122/63  Pulse: 96  Temp: 98.1 F (36.7 C)  TempSrc: Oral  Resp: 18  SpO2: 97%

## 2013-10-19 NOTE — ED Notes (Signed)
Pt dropped window blinds on r/thumb. .5cm laceration to r/thumb noted. Bleeding controlled

## 2013-10-19 NOTE — ED Provider Notes (Signed)
CSN: [id number]     Arrival date & time 10/19/13  1600 History   First MD Initiated Contact with Patient 10/19/13 1616     Chief Complaint  Patient presents with  . Extremity Laceration     (Consider location/radiation/quality/duration/timing/severity/associated sxs/prior Treatment) HPI Comments: 28 yo male with no medical hx except trauma presents with right thumb laceration PTA.  Pt had blinds fall on him, mild bleeding.  Tetanus unknown.  Pain with palpation, full rom  The history is provided by the patient.    Past Medical History  Diagnosis Date  . Shoulder dislocation    Past Surgical History  Procedure Laterality Date  . I&d extremity  03/12/2012    Procedure: IRRIGATION AND DEBRIDEMENT EXTREMITY;  Surgeon: Toni ArthursJohn Hewitt, MD;  [facility];  Service: Orthopedics;  Laterality: Left;  . Hand surgery     Family History  Problem Relation Age of Onset  . Cancer Other   . Diabetes Other    History  Substance Use Topics  . Smoking status: Never Smoker   . Smokeless tobacco: Not on file  . Alcohol Use: Yes     Comment: occ.    Review of Systems  Musculoskeletal: Negative for arthralgias.  Skin: Positive for wound. Negative for color change.  Neurological: Negative for syncope and headaches.      Allergies  Review of patient's allergies indicates no known allergies.  Home Medications  No current outpatient prescriptions on file. BP 122/63  Pulse 96  Temp(Src) 98.1 F (36.7 C) (Oral)  Resp 18  SpO2 97% Physical Exam  Nursing note and vitals reviewed. Constitutional: He appears well-developed and well-nourished. No distress.  HENT:  Head: Normocephalic and atraumatic.  Neck: Normal range of motion.  Pulmonary/Chest: Effort normal.  Musculoskeletal: He exhibits tenderness. He exhibits no edema.  Full rom of thumb in F/E, 2 pt sensation intact dist, nv intact  Neurological: He is alert.  Skin: Skin is warm.  2 cm superficial laceration to dorsum of mid  thumb     ED Course  Procedures (including critical care time) LACERATION REPAIR Performed by: Enid SkeensZAVITZ, Micca Matura M Authorized by: Enid SkeensZAVITZ, Adalina Dopson M Consent: Verbal consent obtained. Risks and benefits: risks, benefits and alternatives were discussed Consent given by: patient Patient identity confirmed: provided demographic data Prepped and Draped in normal sterile fashion Wound explored  Laceration Location: 2 cm Laceration Length: right thumb No Foreign Bodies seen or palpated Anesthesia: local infiltration Local anesthetic: lidocaine 2%  Anesthetic total: 3 ml Amount of cleaning: standard  Skin closure: approximated Number of sutures: 2  Technique: interupted  Patient tolerance: Patient tolerated the procedure well with no immediate complications.   Labs Review Labs Reviewed - No data to display Imaging Review No results found.   EKG Interpretation None      MDM   Final diagnoses:  Laceration of right hand    Lac repair. Wound irrigated. Repaired. Fup discussed for suture removal.  Tetanus given.  Results and differential diagnosis were discussed with the patient. Close follow up outpatient was discussed, patient comfortable with the plan.   Filed Vitals:   10/19/13 1639  BP: 122/63  Pulse: 96  Temp: 98.1 F (36.7 C)  TempSrc: Oral  Resp: 18  SpO2: 97%         Enid SkeensJoshua M Samuell Knoble, MD 10/19/13 1701

## 2013-10-19 NOTE — ED Notes (Signed)
EDP at bedside. Triage delayed 

## 2015-08-12 IMAGING — CR DG SHOULDER 2+V*R*
2 series · 2 of 2 positions shown · non-contrast
Comparison: 10/10/2012 .

CLINICAL DATA: Dislocation history.

EXAM:
RIGHT SHOULDER - 2+ VIEW

[w shoulder external right]
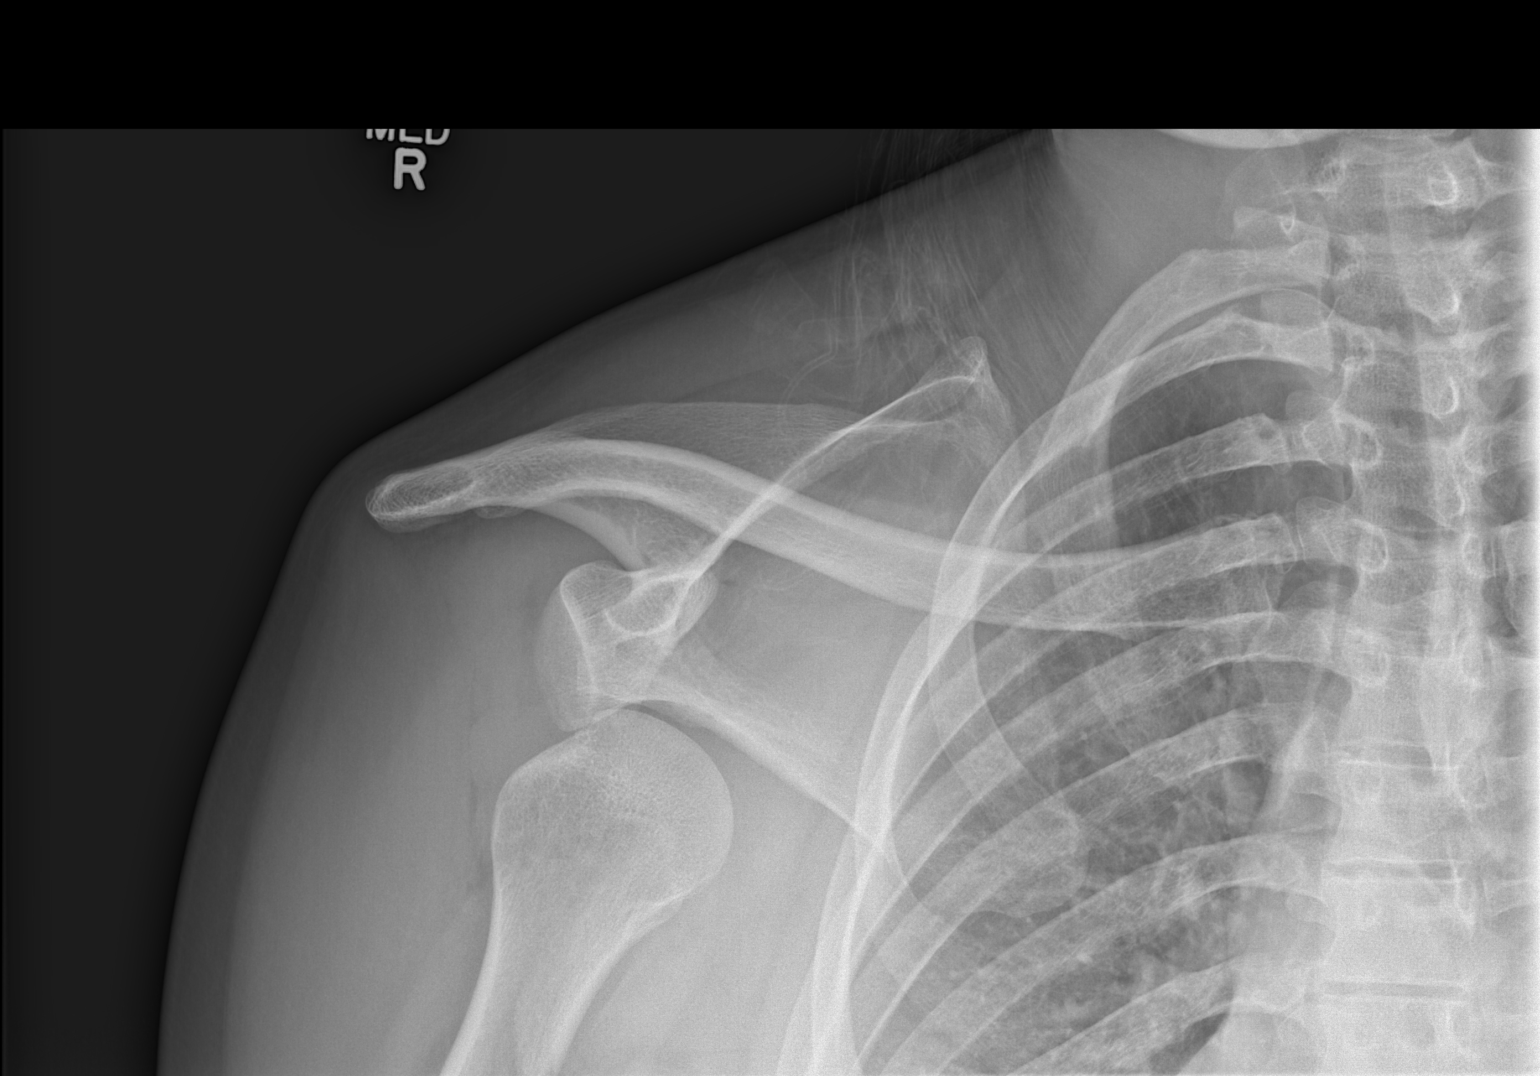

[w shoulder internal right]
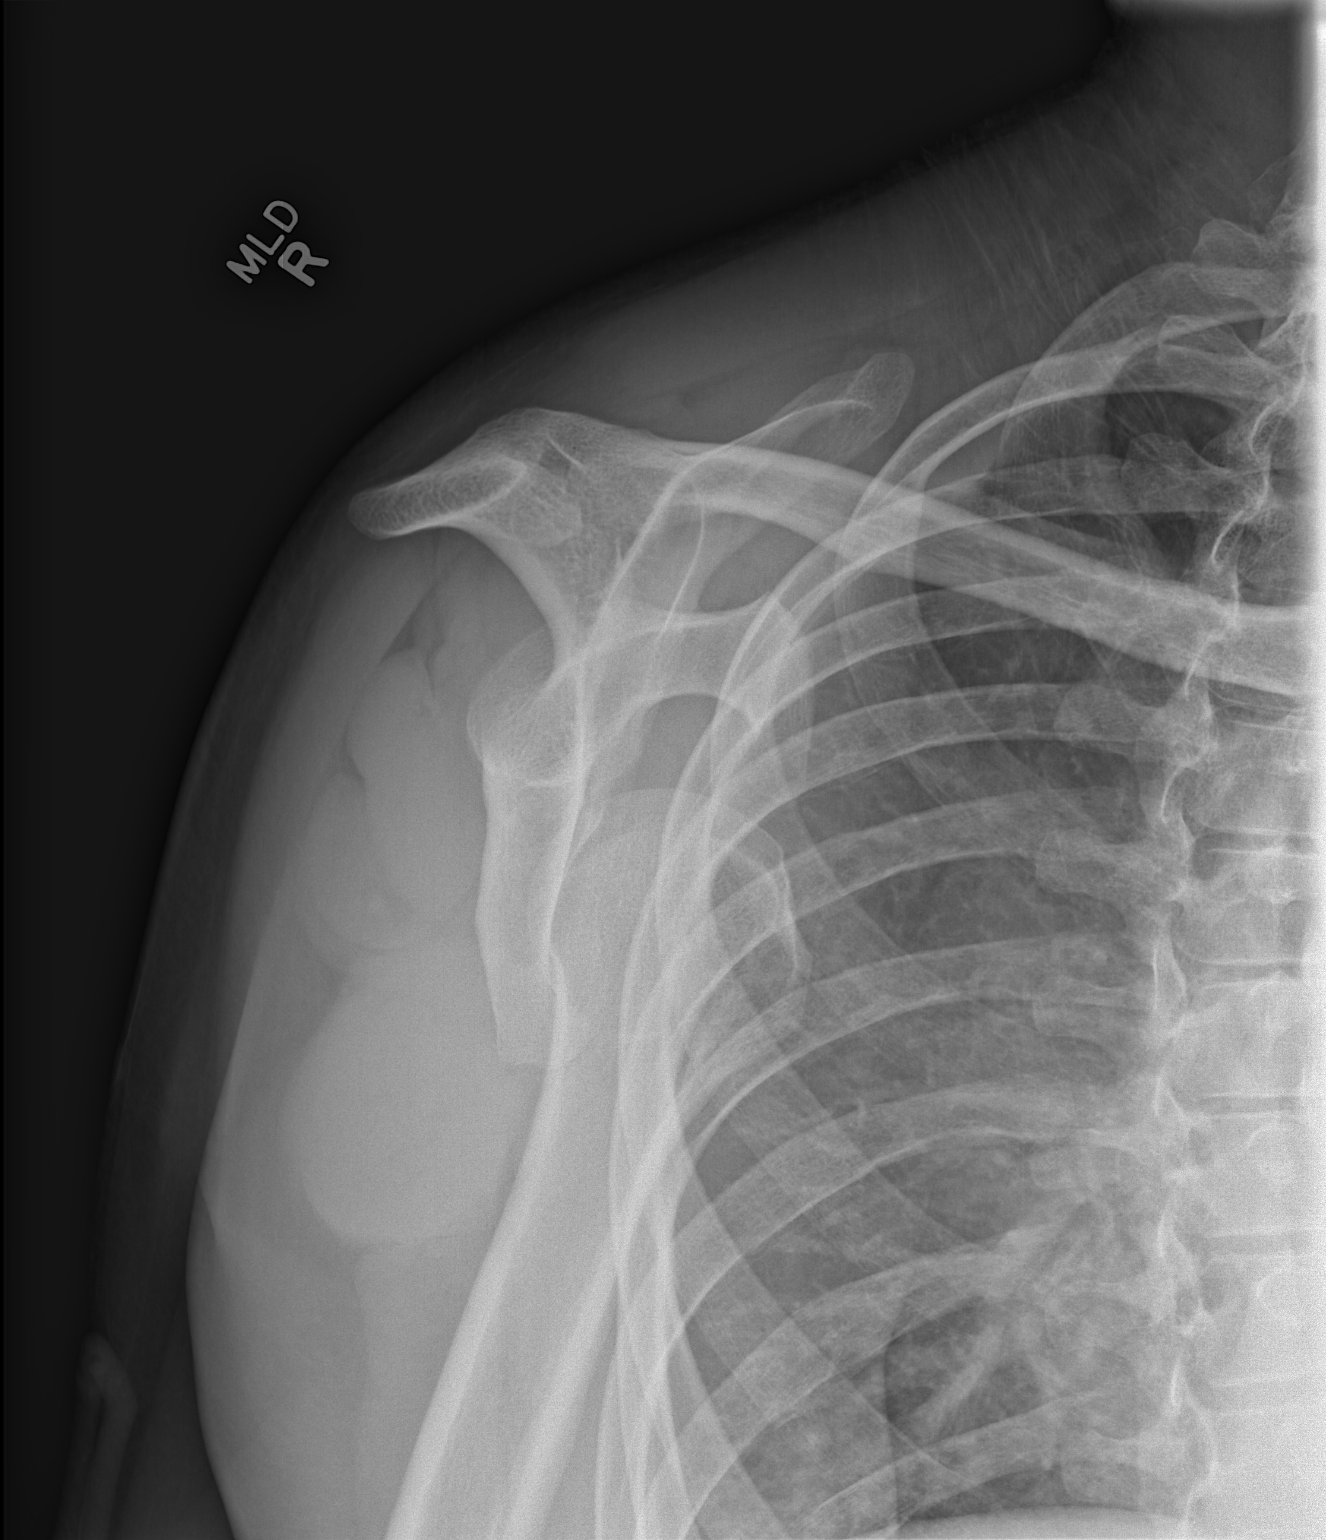

[2 of 2 positions shown; findings below may reference images not displayed]

FINDINGS: Anterior subglenoid right shoulder dislocation is present. Subtle
Hill-Sachs lesion cannot be excluded. No other abnormality
identified.
IMPRESSION: Anterior subglenoid right shoulder dislocation. Subtle Hill-Sachs
lesion cannot be excluded .
